# Patient Record
Sex: Female | Born: 1990 | Race: White | Hispanic: No | Marital: Single
Health system: Southern US, Community
[De-identification: ages and names within clinical notes are randomized; demographics above are authoritative.]

---

## 2013-07-03 ENCOUNTER — Encounter (HOSPITAL_COMMUNITY): Payer: Self-pay | Admitting: Emergency Medicine

## 2013-07-03 ENCOUNTER — Emergency Department (HOSPITAL_COMMUNITY)
Admission: EM | Admit: 2013-07-03 | Discharge: 2013-07-03 | Disposition: A | Discharge status: Home / Self Care | Payer: Worker's Compensation | Attending: Emergency Medicine | Admitting: Emergency Medicine

## 2013-07-03 ENCOUNTER — Emergency Department (HOSPITAL_COMMUNITY): Payer: Worker's Compensation

## 2013-07-03 DIAGNOSIS — Y990 Civilian activity done for income or pay: Secondary | ICD-10-CM | POA: Insufficient documentation

## 2013-07-03 DIAGNOSIS — W540XXA Bitten by dog, initial encounter: Secondary | ICD-10-CM | POA: Insufficient documentation

## 2013-07-03 DIAGNOSIS — S61409A Unspecified open wound of unspecified hand, initial encounter: Secondary | ICD-10-CM | POA: Insufficient documentation

## 2013-07-03 DIAGNOSIS — T148XXA Other injury of unspecified body region, initial encounter: Secondary | ICD-10-CM

## 2013-07-03 DIAGNOSIS — Y9229 Other specified public building as the place of occurrence of the external cause: Secondary | ICD-10-CM | POA: Insufficient documentation

## 2013-07-03 DIAGNOSIS — R209 Unspecified disturbances of skin sensation: Secondary | ICD-10-CM | POA: Insufficient documentation

## 2013-07-03 MED ORDER — AMOXICILLIN-POT CLAVULANATE 875-125 MG PO TABS
1.0000 | ORAL_TABLET | Freq: Once | ORAL | Status: AC
Start: 2013-07-03 — End: 2013-07-03
  Administered 2013-07-03: 1 via ORAL
  Filled 2013-07-03: qty 1

## 2013-07-03 MED ORDER — HYDROCODONE-ACETAMINOPHEN 5-325 MG PO TABS: 1.0000 | ORAL_TABLET | Freq: Four times a day (QID) | ORAL | Status: AC | PRN

## 2013-07-03 MED ORDER — AMOXICILLIN-POT CLAVULANATE 875-125 MG PO TABS: 1.0000 | ORAL_TABLET | Freq: Two times a day (BID) | ORAL | Status: AC

## 2013-07-03 NOTE — ED Notes (Signed)
Per PA, change to a level three.

## 2013-07-03 NOTE — ED Provider Notes (Signed)
CSN: [id number]     Arrival date & time 07/03/13  1216 History  This chart was scribed for Raymon Mutton, PA working with Ethelda Chick, MD by Quintella Reichert, ED Scribe. This patient was seen in room TR11C/TR11C and the patient's care was started at 2:23 PM.  Chief Complaint  Patient presents with  . Animal Bite    The history is provided by the patient. No language interpreter was used.    HPI Comments: Tammy Weiss is a 22 y.o. female with no pertinent past medical history who presents to the Emergency Department complaining of animal bite sustained earlier today.  Pt states she was at work around 12 PM when she was bit on the top of the left hand by a pit bull.  Dog is UTD on all vaccinations.  She now presents with a laceration to the top of the left hand and moderate associated pain to the area.  Bandage was applied at triage and bleeding is controlled presently.  Pt also complains of some associated intermittent numbness in the fingers of that hand which resolves when she moves the fingers.  She denies weakness or tingling.  Pt states she thinks her tetanus is UTD.   No past medical history on file.  No past surgical history on file.  No family history on file.   History  Substance Use Topics  . Smoking status: Not on file  . Smokeless tobacco: Not on file  . Alcohol Use: No    OB History   Grav Para Term Preterm Abortions TAB SAB Ect Mult Living                  Review of Systems  Skin: Positive for wound.  Neurological: Positive for numbness. Negative for weakness.  All other systems reviewed and are negative.     Allergies  Review of patient's allergies indicates no known allergies.  Home Medications   Current Outpatient Rx  Name  Route  Sig  Dispense  Refill  . amoxicillin-clavulanate (AUGMENTIN) 875-125 MG per tablet   Oral   Take 1 tablet by mouth 2 (two) times daily. One po bid x 7 days   14 tablet   0   . HYDROcodone-acetaminophen  (NORCO/VICODIN) 5-325 MG per tablet   Oral   Take 1 tablet by mouth every 6 (six) hours as needed.   5 tablet   0     BP 144/95  Pulse 88  Temp(Src) 98.1 F (36.7 C) (Oral)  Resp 18  Wt 148 lb (67.132 kg)  SpO2 100%  LMP 06/05/2013  Physical Exam  Nursing note and vitals reviewed. Constitutional: She is oriented to person, place, and time. She appears well-developed and well-nourished. No distress.  HENT:  Head: Normocephalic and atraumatic.  Eyes: Conjunctivae and EOM are normal. Pupils are equal, round, and reactive to light.  Neck: Normal range of motion. Neck supple.  Cardiovascular: Normal rate, regular rhythm and normal heart sounds.   Pulses:      Radial pulses are 2+ on the right side, and 2+ on the left side.  Pulmonary/Chest: Effort normal and breath sounds normal. No respiratory distress. She has no wheezes. She has no rales.  Musculoskeletal: Normal range of motion.  Neurological: She is alert and oriented to person, place, and time. She exhibits normal muscle tone. Coordination normal.  Strength intact to MCP, PIP, DIP joints of the digits of the left hand.  Sensation intact to left hand with differentiation to sharp  and dull touch   Skin: Skin is warm and dry.  Approximately 5.5 cm laceration localized to the hypothenar region of the left hand. Bleeding controlled. Deep.   Psychiatric: She has a normal mood and affect. Her behavior is normal. Thought content normal.    ED Course  Procedures (including critical care time)  DIAGNOSTIC STUDIES: Oxygen Saturation is 100% on room air, normal by my interpretation.    COORDINATION OF CARE: 2:27 PM-Discussed treatment plan which includes laceration repair with pt at bedside and pt agreed to plan.    LACERATION REPAIR Performed by: Raymon Mutton, PA Consent: Verbal consent obtained. Risks and benefits: risks, benefits and alternatives were discussed Patient identity confirmed: provided demographic data Time  out performed prior to procedure Prepped and Draped in normal sterile fashion Wound explored Laceration Location: left hypothenar Laceration Length: 5.5 cm No Foreign Bodies seen or palpated Anesthesia: local infiltration Local anesthetic: lidocaine 1% without epinephrine Anesthetic total: 6 ml Irrigation method: syringe Amount of cleaning: extensive - soaked in wash solution for 30 minutes, irrigated and thoroughly cleaned before procedure Skin closure: loose approximate Number of sutures or staples: 3 sutures Technique: single interrupted Patient tolerance: Patient tolerated the procedure well with no immediate complications.  Dg Hand Complete Left  07/03/2013   CLINICAL DATA:  Left hand pain following a dog bite in the region of the carpals and 5th metacarpal.  EXAM: LEFT HAND - COMPLETE 3+ VIEW  COMPARISON:  None.  FINDINGS: Soft tissue air and overlying bandage material in the ulnar aspect of the wrist and proximal hand. No fracture, dislocation or radiopaque foreign body seen.  IMPRESSION: No fracture.   Electronically Signed   By: Gordan Payment M.D.   On: 07/03/2013 16:48   Labs Review Labs Reviewed - No data to display  Imaging Review Dg Hand Complete Left  07/03/2013   CLINICAL DATA:  Left hand pain following a dog bite in the region of the carpals and 5th metacarpal.  EXAM: LEFT HAND - COMPLETE 3+ VIEW  COMPARISON:  None.  FINDINGS: Soft tissue air and overlying bandage material in the ulnar aspect of the wrist and proximal hand. No fracture, dislocation or radiopaque foreign body seen.  IMPRESSION: No fracture.   Electronically Signed   By: Gordan Payment M.D.   On: 07/03/2013 16:48    EKG Interpretation   None       MDM   1. Animal bite    Medications  amoxicillin-clavulanate (AUGMENTIN) 875-125 MG per tablet 1 tablet (1 tablet Oral Given 07/03/13 1608)   Filed Vitals:   07/03/13 1218 07/03/13 1701  BP: 144/95   Pulse: 88 82  Temp: 98.1 F (36.7 C) 98.8 F (37.1 C)   TempSrc: Oral Oral  Resp: 18 16  Weight: 148 lb (67.132 kg)   SpO2: 100% 100%    I personally performed the services described in this documentation, which was scribed in my presence. The recorded information has been reviewed and is accurate.  Patient presenting to the ED with a dog bite PTA to the left hand. 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 Patient called office and reported that the dog that bit her had all of its vaccinations. Patient reported that she is up to date with her vaccinations, stated that she has gotten her tetanus within 2-3 years.  Alert and oriented. GCS 15. Heart rate and rhythm normal. Lungs clear to auscultation to upper and lower lobes  bilaterally. Pulses palpable and strong, radial 2+. Approximately 5.5 cm laceration to the left hypothenar region, bleeding controlled. Strength intact to MCP, PIP, and DIP joints to the digits of the left hand - full ROM to the digits of the left hand - negative involvement of tendon and deep tendon. Sensation intact with differentiation to sharp and dull touch. Patient neurovascularly intact.  Plain film of left hand negative for fractures and foreign bodies.  Discussed case with attending physician, Dr. Casper Harrison who recommended limited sutures to loosely approximate in order for the wound to heal. Due to wound being deep - sutures needed to loosely approximate closure. Discussed with patient that animal bites regularly are not closed due to risk of infection, but due to the wound being deep will have to apply a few sutures to enable the wound to heal - may increase risk of infection, but patient will be placed on antibiotic therapy and will have to closely monitor - patient understood and agreed. Wound soaked and cleaned extensively - 3 sutures placed using 5-0 prolene - loosely approximated. Good hemostasis, patient tolerated procedure well. Antibiotic ointment placed and clean  bandage.   Patient stable, afebrile. Patient tolerated procedure well. Patient neurovascularly intact. Negative involvement of tendon or deep tendon. Discharged patient. Discharged patient with Augmentin and pain medications - discussed course, precautions and disposal technique. Discussed with patient proper wound healing. Educated patient on the symptoms to watch out for regarding infection. Stressed importance of proper hygiene, medical compliance, and follow-up. Recommended patient to follow-up with Urgent Care Center/PCP/ED within 2 days for wound to be re-assessed. Discussed with patient to closely monitor symptoms and if symptoms are to worsen or change to report back to the ED - strict return instructions given.  Patient agreed to plan of care, understood, all questions answered.   Raymon Mutton, PA-C 07/05/13 [address] PA-C 07/05/13 1446

## 2013-07-03 NOTE — ED Notes (Signed)
Reports being bit by pit bull at work today, has laceration to left hand, bleeding controlled, bandage applied at triage.

## 2013-07-05 NOTE — ED Provider Notes (Signed)
This provider spoke with the patient on 07/05/2013 at 2:46 PM. Patient reported that she was seen by [facility] Physician, Dr. Web today. Reported that the wound is doing well - stated that she has been cleaning it daily and keep good care. Reported that the physician placed the patient on another antibiotics regimen with Augmentin due to mild swelling and redness around the wound to be noted. Patient denied numbness, tingling, loss of sensation, warmth, decreased ROM to fingers, drainage, fever, bleeding, pus. Patient reported that she has an appointment with physician to be seen again tomorrow - patient has close follow-up. Discussed with patient to continue to monitor closely and if symptoms worsen or change to report back to the ED - strict return instructions given. Patient agreed to plan of care, understood, all questions answered.   Raymon Mutton, PA-C 07/05/13 1449

## 2013-07-07 NOTE — ED Provider Notes (Signed)
Medical screening examination/treatment/procedure(s) were performed by non-physician practitioner and as supervising physician I was immediately available for consultation/collaboration.  EKG Interpretation   None        Martha K Linker, MD 07/07/13 1504 

## 2013-07-07 NOTE — ED Provider Notes (Signed)
Medical screening examination/treatment/procedure(s) were performed by non-physician practitioner and as supervising physician I was immediately available for consultation/collaboration.  EKG Interpretation   None        Ethelda Chick, MD 07/07/13 1505

## 2015-07-16 ENCOUNTER — Ambulatory Visit
Admission: RE | Admit: 2015-07-16 | Discharge: 2015-07-16 | Disposition: A | Discharge status: Home / Self Care | Payer: No Typology Code available for payment source | Source: Ambulatory Visit | Attending: Occupational Medicine | Admitting: Occupational Medicine

## 2015-07-16 ENCOUNTER — Other Ambulatory Visit: Payer: Self-pay | Admitting: Occupational Medicine

## 2015-07-16 DIAGNOSIS — Z021 Encounter for pre-employment examination: Secondary | ICD-10-CM

## 2016-05-12 IMAGING — CR DG CHEST 1V
1 series · 1 of 1 positions shown · non-contrast
Comparison: None.

CLINICAL DATA: Smoker.  Pre-employment chest x-ray .

EXAM:
CHEST 1 VIEW

[w chest pa]
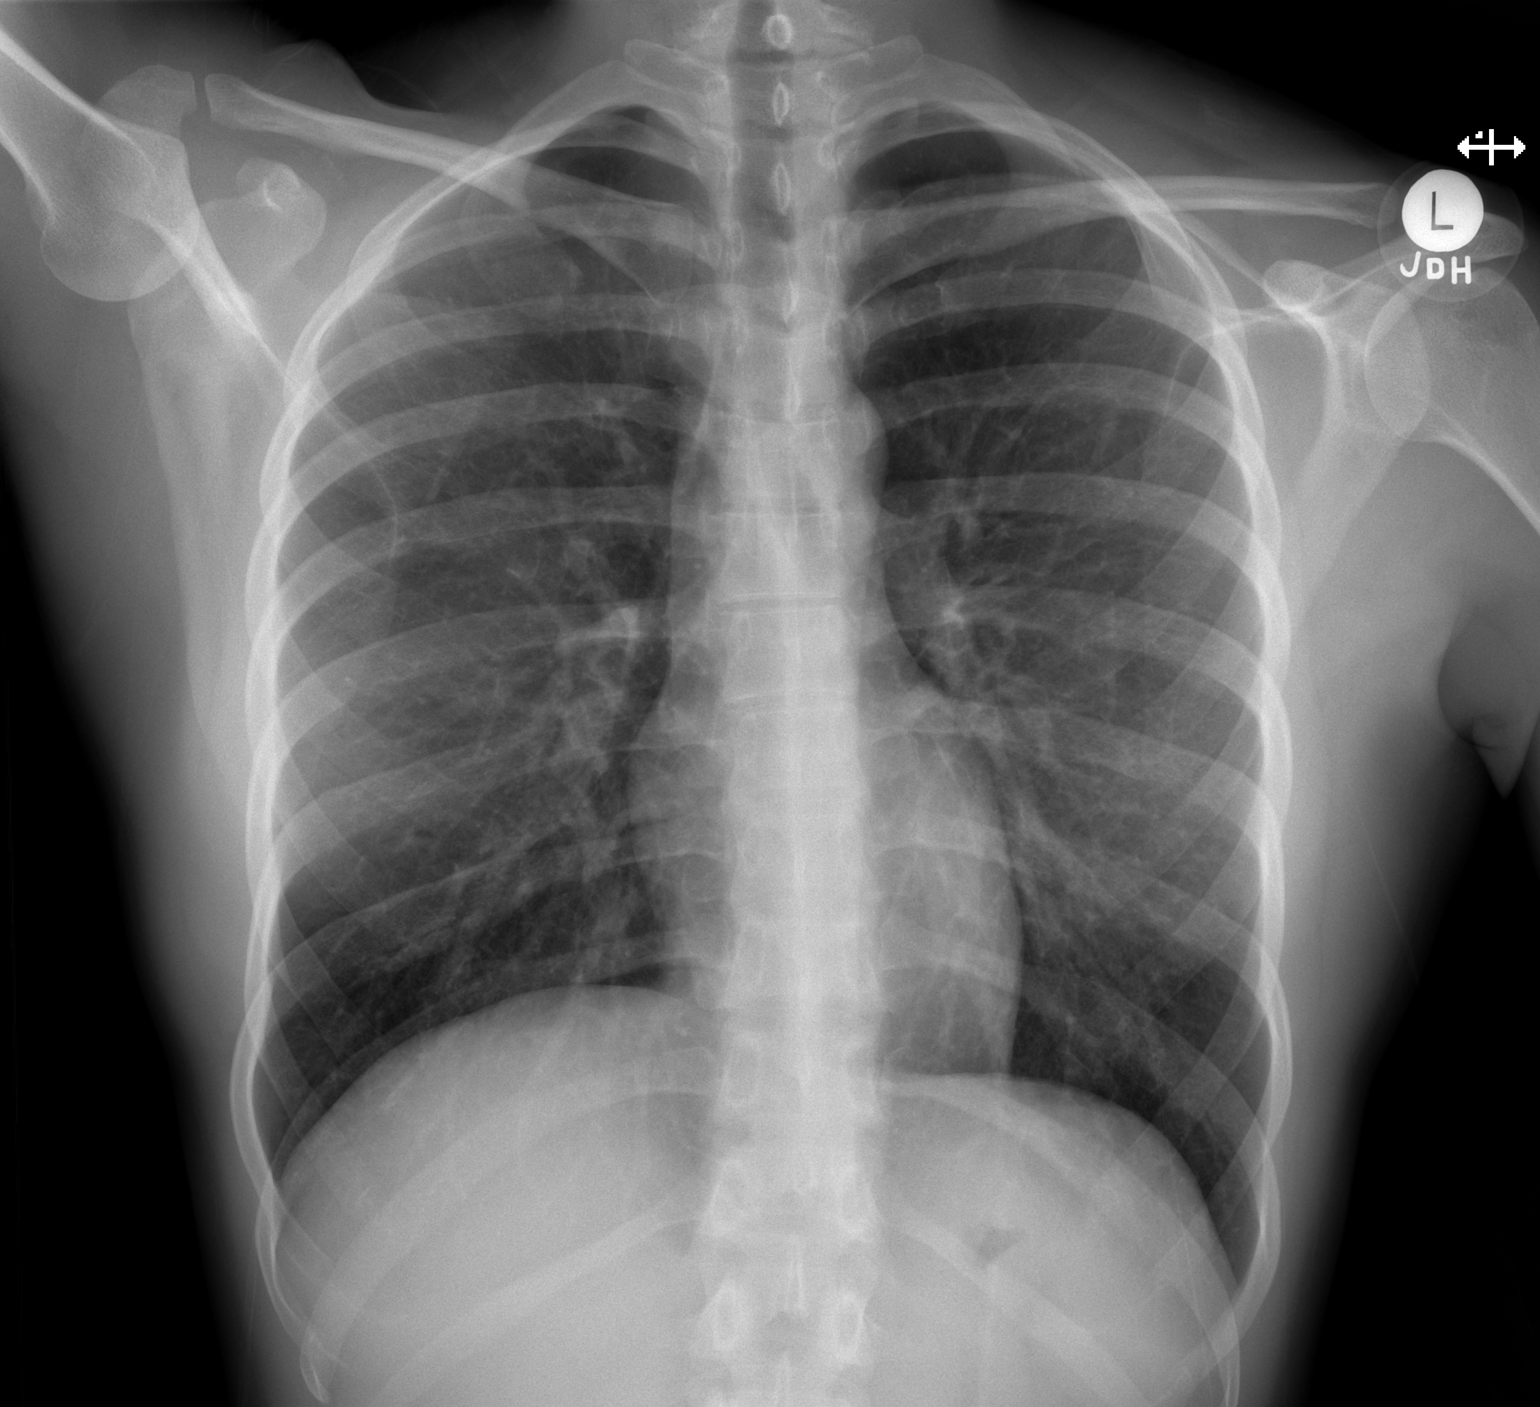

[1 of 1 positions shown; findings below may reference images not displayed]

FINDINGS: The heart size and mediastinal contours are within normal limits.
Both lungs are clear. The visualized skeletal structures are
unremarkable.
IMPRESSION: No active disease.

## 2019-10-08 ENCOUNTER — Ambulatory Visit: Payer: Self-pay | Attending: Internal Medicine

## 2019-10-08 DIAGNOSIS — Z23 Encounter for immunization: Secondary | ICD-10-CM

## 2019-10-08 NOTE — Progress Notes (Signed)
   Covid-19 Vaccination Clinic  Name:  Tammy Weiss    MRN: [id number] DOB: Jun 11, 1991  10/08/2019  [name] was observed post Covid-19 immunization for 15 minutes without incident. She was provided with Vaccine Information Sheet and instruction to access the V-Safe system.   [name] was instructed to call 911 with any severe reactions post vaccine: Marland Kitchen Difficulty breathing  . Swelling of face and throat  . A fast heartbeat  . A bad rash all over body  . Dizziness and weakness   Immunizations Administered    Name Date Dose VIS Date Route   Pfizer COVID-19 Vaccine 10/08/2019  2:29 PM 0.3 mL 07/08/2019 Intramuscular   Manufacturer: [employer], [employer]   Lot: [id number]   NDC: [address]-4

## 2019-11-01 ENCOUNTER — Ambulatory Visit: Payer: Self-pay

## 2019-11-08 ENCOUNTER — Ambulatory Visit: Payer: Self-pay | Attending: Internal Medicine

## 2019-11-08 DIAGNOSIS — Z23 Encounter for immunization: Secondary | ICD-10-CM

## 2019-11-08 NOTE — Progress Notes (Signed)
   Covid-19 Vaccination Clinic  Name:  Tammy Weiss    MRN: [id number] DOB: 1991/05/14  11/08/2019  [name] was observed post Covid-19 immunization for 15 minutes without incident. She was provided with Vaccine Information Sheet and instruction to access the V-Safe system.   [name] was instructed to call 911 with any severe reactions post vaccine: Marland Kitchen Difficulty breathing  . Swelling of face and throat  . A fast heartbeat  . A bad rash all over body  . Dizziness and weakness   Immunizations Administered    Name Date Dose VIS Date Route   Pfizer COVID-19 Vaccine 11/08/2019  9:49 AM 0.3 mL 07/08/2019 Intramuscular   Manufacturer: [employer], [employer]   Lot: [id number]   NDC: [address]-1
